# Patient Record
Sex: Female | Born: 1985 | Race: Black or African American | Hispanic: No | Marital: Single | Smoking: Never smoker
Health system: Southern US, Community
[De-identification: ages and names within clinical notes are randomized; demographics above are authoritative.]

## PROBLEM LIST (undated history)

## (undated) DIAGNOSIS — L309 Dermatitis, unspecified: Secondary | ICD-10-CM

## (undated) HISTORY — PX: OTHER SURGICAL HISTORY: SHX169

## (undated) HISTORY — DX: Dermatitis, unspecified: L30.9

---

## 2013-01-27 ENCOUNTER — Encounter (HOSPITAL_COMMUNITY): Payer: Self-pay | Admitting: Emergency Medicine

## 2013-01-27 ENCOUNTER — Emergency Department (HOSPITAL_COMMUNITY)
Admission: EM | Admit: 2013-01-27 | Discharge: 2013-01-27 | Disposition: A | Discharge status: Home / Self Care | Payer: Self-pay | Attending: Emergency Medicine | Admitting: Emergency Medicine

## 2013-01-27 DIAGNOSIS — Z0000 Encounter for general adult medical examination without abnormal findings: Secondary | ICD-10-CM

## 2013-01-27 DIAGNOSIS — Z0289 Encounter for other administrative examinations: Secondary | ICD-10-CM | POA: Insufficient documentation

## 2013-01-27 DIAGNOSIS — F17200 Nicotine dependence, unspecified, uncomplicated: Secondary | ICD-10-CM | POA: Insufficient documentation

## 2013-01-27 MED ORDER — ACETAMINOPHEN 325 MG PO TABS
650.0000 mg | ORAL_TABLET | Freq: Once | ORAL | Status: AC
  Administered 2013-01-27: 650 mg via ORAL
  Filled 2013-01-27: qty 2

## 2013-01-27 NOTE — ED Notes (Signed)
Pt states she wants to donate plasma but they would allow her to because her PR read 106 on their machine. States she was instructed to come to er to obtain permission to donate plasma. States she does not think her pulse rate was high, she denies any cardiac complaints and states she "Feels fine." A&Ox4, resp e/u
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
## 2013-01-30 NOTE — ED Provider Notes (Signed)
Medical screening examination/treatment/procedure(s) were performed by non-physician practitioner and as supervising physician I was immediately available for consultation/collaboration.  Christopher J. Pollina, MD 01/30/13 1712 

## 2013-03-19 ENCOUNTER — Emergency Department (HOSPITAL_COMMUNITY)
Admission: EM | Admit: 2013-03-19 | Discharge: 2013-03-20 | Disposition: A | Discharge status: Home / Self Care | Payer: Self-pay | Attending: Emergency Medicine | Admitting: Emergency Medicine

## 2013-03-19 ENCOUNTER — Encounter (HOSPITAL_COMMUNITY): Payer: Self-pay | Admitting: Emergency Medicine

## 2013-03-19 DIAGNOSIS — F17200 Nicotine dependence, unspecified, uncomplicated: Secondary | ICD-10-CM | POA: Insufficient documentation

## 2013-03-19 DIAGNOSIS — J309 Allergic rhinitis, unspecified: Secondary | ICD-10-CM

## 2013-03-19 DIAGNOSIS — I779 Disorder of arteries and arterioles, unspecified: Secondary | ICD-10-CM | POA: Insufficient documentation

## 2013-03-19 NOTE — ED Notes (Signed)
Pt states she has been having a scratchy throat, nasal congestions, and chills for two days.  Pt states she recently got a new cat, and does not know if she may be allergic

## 2013-03-20 LAB — RAPID STREP SCREEN (MED CTR MEBANE ONLY): Streptococcus, Group A Screen (Direct): NEGATIVE

## 2013-03-20 NOTE — ED Provider Notes (Signed)
Medical screening examination/treatment/procedure(s) were performed by non-physician practitioner and as supervising physician I was immediately available for consultation/collaboration.   Juana Montini, MD 03/20/13 0828 

## 2013-03-20 NOTE — ED Provider Notes (Signed)
CSN: [id number]     Arrival date & time 03/19/13  2040 History   First MD Initiated Contact with Patient 03/19/13 2304     Chief Complaint  Patient presents with  . URI   (Consider location/radiation/quality/duration/timing/severity/associated sxs/prior Treatment) HPI History provided by pt.   Pt presents w/ scratchy throat x 2-3 days.  Associated w/ nasal congestion, non-productive cough and alternating chills/sweats.  Denies fever, headache, sneezing, throat pain, chest pain, SOB.  No known sick contacts.  Bought a new cat the day of onset of sx.  No PMH.  History reviewed. No pertinent past medical history. History reviewed. No pertinent past surgical history. No family history on file. History  Substance Use Topics  . Smoking status: Current Every Day Smoker    Types: Cigarettes  . Smokeless tobacco: Not on file  . Alcohol Use: No   OB History   Grav Para Term Preterm Abortions TAB SAB Ect Mult Living                 Review of Systems  All other systems reviewed and are negative.    Allergies  Review of patient's allergies indicates no known allergies.  Home Medications  No current outpatient prescriptions on file. BP 143/77  Pulse 80  Temp(Src) 98.7 F (37.1 C) (Oral)  Resp 16  Ht 5\' 2"  (1.575 m)  Wt 155 lb 7 oz (70.506 kg)  BMI 28.42 kg/m2  SpO2 100%  LMP 03/04/2013 Physical Exam  Nursing note and vitals reviewed. Constitutional: She is oriented to person, place, and time. She appears well-developed and well-nourished. No distress.  HENT:  Head: Normocephalic and atraumatic.  Erythema soft palate, uvula and posterior pharynx.  Bilateral tonsillar exudate.  Uvula mid-line and no trismus.  Eyes:  Normal appearance  Neck: Normal range of motion.  Cardiovascular: Normal rate and regular rhythm.   Pulmonary/Chest: Effort normal and breath sounds normal. No respiratory distress.  Musculoskeletal: Normal range of motion.  Lymphadenopathy:    She has cervical  adenopathy.  Neurological: She is alert and oriented to person, place, and time.  Skin: Skin is warm and dry. No rash noted.  Psychiatric: She has a normal mood and affect. Her behavior is normal.    ED Course  Procedures (including critical care time) Labs Review Labs Reviewed - No data to display Imaging Review No results found.  EKG Interpretation   None       MDM   1. Allergic rhinitis    27yo healthy F presents w/ nasal congestion, cough and pruritic/scratchy throat x 2-3 days.  Onset the same day that she brought home a new cat and patient concerned she may be allergic.  Exam sig for tonsillar exudate and injection.  Rapid strep screen pending.  12:12 AM   Strep screen neg.  Results discussed w/ pt.  Recommended claritin and benadryl for what is likely an allergic reaction to cat, but rest and fluids for possible virus.  Return precautions discussed.     Otilio Miu, PA-C 03/20/13 [phone]

## 2013-03-22 LAB — CULTURE, GROUP A STREP

## 2014-08-20 ENCOUNTER — Emergency Department (HOSPITAL_COMMUNITY)
Admission: EM | Admit: 2014-08-20 | Discharge: 2014-08-20 | Disposition: A | Discharge status: Home / Self Care | Payer: Self-pay | Attending: Emergency Medicine | Admitting: Emergency Medicine

## 2014-08-20 ENCOUNTER — Encounter (HOSPITAL_COMMUNITY): Payer: Self-pay | Admitting: *Deleted

## 2014-08-20 DIAGNOSIS — Z720 Tobacco use: Secondary | ICD-10-CM | POA: Insufficient documentation

## 2014-08-20 DIAGNOSIS — B372 Candidiasis of skin and nail: Secondary | ICD-10-CM | POA: Insufficient documentation

## 2014-08-20 DIAGNOSIS — B3789 Other sites of candidiasis: Secondary | ICD-10-CM | POA: Insufficient documentation

## 2014-08-20 MED ORDER — NYSTATIN 100000 UNIT/GM EX CREA: 1.0000 "application " | TOPICAL_CREAM | Freq: Three times a day (TID) | CUTANEOUS | Status: DC

## 2014-08-20 NOTE — ED Notes (Signed)
Pt in c/o rash under her breasts since last week, no distress noted

## 2014-08-20 NOTE — Discharge Instructions (Signed)
Use nystatin cream as directed to help with this skin rash. Keep the area clean and dry, and change your clothes when they become moist. Follow up with [facility] wellness in 1 week for recheck and to establish care. Return to the ER for changes or worsening symptoms.   Yeast Infection of the Skin Some yeast on the skin is normal, but sometimes it causes an infection. If you have a yeast infection, it shows up as white or light brown patches on brown skin. You can see it better in the summer on tan skin. It causes light-colored holes in your suntan. It can happen on any area of the body. This cannot be passed from person to person. HOME CARE  Scrub your skin daily with a dandruff shampoo. Your rash may take a couple weeks to get well.  Do not scratch or itch the rash. GET HELP RIGHT AWAY IF:   You get another infection from scratching. The skin may get warm, red, and may ooze fluid.  The infection does not seem to be getting better. MAKE SURE YOU:  Understand these instructions.  Will watch your condition.  Will get help right away if you are not doing well or get worse. Document Released: 02/18/2008 Document Revised: 05/30/2011 Document Reviewed: 02/18/2008 [facility] Patient Information 2015 [place], [place]. This information is not intended to replace advice given to you by your health care provider. Make sure you discuss any questions you have with your health care provider.  Rash A rash is a change in the color or feel of your skin. There are many different types of rashes. You may have other problems along with your rash. HOME CARE  Avoid the thing that caused your rash.  Do not scratch your rash.  You may take cools baths to help stop itching.  Only take medicines as told by your doctor.  Keep all doctor visits as told. GET HELP RIGHT AWAY IF:   Your pain, puffiness (swelling), or redness gets worse.  You have a fever.  You have new or severe problems.  You have  body aches, watery poop (diarrhea), or you throw up (vomit).  Your rash is not better after 3 days. MAKE SURE YOU:   Understand these instructions.  Will watch your condition.  Will get help right away if you are not doing well or get worse. Document Released: 08/24/2007 Document Revised: 05/30/2011 Document Reviewed: 12/20/2010 [facility] Patient Information 2015 [place], [place]. This information is not intended to replace advice given to you by your health care provider. Make sure you discuss any questions you have with your health care provider.

## 2014-08-20 NOTE — ED Provider Notes (Signed)
CSN: [id number]     Arrival date & time 08/20/14  1725 History  This chart was scribed for Levi StraussMercedes Camprubi-Soms, PA-C, working with Vanetta MuldersScott Zackowski, MD by Chestine SporeSoijett Blue, ED Scribe. The patient was seen in room TR06C/TR06C at 5:39 PM.    Chief Complaint  Patient presents with  . Rash      Patient is a 29 y.o. female presenting with rash. The history is provided by the patient. No language interpreter was used.  Rash Location:  Torso Torso rash location:  L chest and R chest Quality: itchiness   Quality: not draining, not painful, not red, not swelling and not weeping   Severity:  Moderate Onset quality:  Sudden Duration:  1 week Timing:  Constant Progression:  Unchanged Chronicity:  New Context: new detergent/soap   Context: not animal contact, not exposure to similar rash, not medications, not plant contact and not sick contacts   Relieved by: tea tree oil and apple cider vinegar. Worsened by:  Nothing tried Ineffective treatments:  None tried Associated symptoms: no abdominal pain, no fever, no joint pain, no myalgias, no nausea, no shortness of breath, no throat swelling, no tongue swelling and not vomiting      Gregg L Ezequiel EssexGable is a 29 y.o. female with no significant medical hx who presents to the Emergency department complaining of rash onset 1.5 week. She reports that the rash is under both breasts and it itches without pain. Pt reports that it feels as if her skin is discolored and it is dark brown under her breasts with no redness. Pt switched detergents last week, but she denies any new soaps/medications/plants/animals contact or exposure to similar rash. No other affected individuals at home. She states that she has tried keeping the area dried, apple cider vinegar, and tea tree oil with mild relief for her symptoms. No known aggravating factors. She denies redness, warmth, drainage, swelling, CP, SOB, abdominal pain, n/v, tongue swelling, lip swelling, numbness, weakness, and any  other symptoms. Pt does not have a PCP at this time.    History reviewed. No pertinent past medical history. History reviewed. No pertinent past surgical history. History reviewed. No pertinent family history. History  Substance Use Topics  . Smoking status: Current Every Day Smoker    Types: Cigarettes  . Smokeless tobacco: Not on file  . Alcohol Use: No   OB History    No data available     Review of Systems  Constitutional: Negative for fever and chills.  Respiratory: Negative for shortness of breath.   Gastrointestinal: Negative for nausea, vomiting and abdominal pain.  Musculoskeletal: Negative for myalgias and arthralgias.  Skin: Positive for rash.  Allergic/Immunologic: Negative for immunocompromised state.  Neurological: Negative for weakness and numbness.   A complete 10 system review of systems was obtained and all systems are negative except as noted in the HPI and PMH.     Allergies  Review of patient's allergies indicates no known allergies.  Home Medications   Prior to Admission medications   Not on File   BP 125/73 mmHg  Pulse 93  Temp(Src) 98.9 F (37.2 C) (Oral)  Resp 18  SpO2 99% Physical Exam  Constitutional: She is oriented to person, place, and time. Vital signs are normal. She appears well-developed and well-nourished.  Non-toxic appearance. No distress.  Afebrile, nontoxic, NAD  HENT:  Head: Normocephalic and atraumatic.  Mouth/Throat: Mucous membranes are normal.  Eyes: Conjunctivae and EOM are normal. Right eye exhibits no discharge. Left eye  exhibits no discharge.  Neck: Normal range of motion. Neck supple.  Cardiovascular: Normal rate.   Pulmonary/Chest: Effort normal. No respiratory distress.  Abdominal: Normal appearance. She exhibits no distension.  Musculoskeletal: Normal range of motion.  Neurological: She is alert and oriented to person, place, and time. She has normal strength. No sensory deficit.  Skin: Skin is warm, dry and  intact. Rash noted. Rash is papular.  Intertrigonus folds under each breast with a hyperpigmented macerated plaques with some erythematous papules surrounding the area. No warmth or induration, no cellulitis, non-TTP without drainage.   Psychiatric: She has a normal mood and affect. Her behavior is normal.  Nursing note and vitals reviewed.   ED Course  Procedures (including critical care time) DIAGNOSTIC STUDIES: Oxygen Saturation is 99% on RA, nl by my interpretation.    COORDINATION OF CARE: 5:47 PM-Discussed treatment plan which includes nystatin Rx and [facility] and wellness referral with pt at bedside and pt agreed to plan.   Labs Review Labs Reviewed - No data to display  Imaging Review No results found.   EKG Interpretation None      MDM   Final diagnoses:  Yeast infection of the skin  Candidiasis of breast10    28 y.o. female here with yeast infection under each breast, in the intertrigonous folds. No surrounding cellulitis or secondary bacterial infection. No other affected areas, doubt scabies or other parasitic rash. Will have her use nystatin cream x10 days and f/up with [facility] in 1wk for recheck and for establishment of PCP care. I explained the diagnosis and have given explicit precautions to return to the ER including for any other new or worsening symptoms. The patient understands and accepts the medical plan as it's been dictated and I have answered their questions. Discharge instructions concerning home care and prescriptions have been given. The patient is STABLE and is discharged to home in good condition.   I personally performed the services described in this documentation, which was scribed in my presence. The recorded information has been reviewed and is accurate.  BP 125/73 mmHg  Pulse 93  Temp(Src) 98.9 F (37.2 C) (Oral)  Resp 18  SpO2 99%  Meds ordered this encounter  Medications  . nystatin cream (MYCOSTATIN)    Sig: Apply 1 application  topically 3 (three) times daily. Apply to affected area every 8 hours x 10 days    Dispense:  30 g    Refill:  0    Order Specific Question:  Supervising Provider    Answer:  Eber Hong [[id number]]      Jarris Kortz Camprubi-Soms, PA-C 08/20/14 1800  Vanetta Mulders, MD 08/21/14 1640

## 2014-09-15 ENCOUNTER — Ambulatory Visit: Payer: Self-pay

## 2015-04-21 ENCOUNTER — Encounter (HOSPITAL_COMMUNITY): Payer: Self-pay | Admitting: *Deleted

## 2015-04-21 ENCOUNTER — Emergency Department (HOSPITAL_COMMUNITY)
Admission: EM | Admit: 2015-04-21 | Discharge: 2015-04-21 | Disposition: A | Discharge status: Home / Self Care | Payer: BLUE CROSS/BLUE SHIELD | Attending: Emergency Medicine | Admitting: Emergency Medicine

## 2015-04-21 DIAGNOSIS — Z3202 Encounter for pregnancy test, result negative: Secondary | ICD-10-CM | POA: Insufficient documentation

## 2015-04-21 DIAGNOSIS — N3090 Cystitis, unspecified without hematuria: Secondary | ICD-10-CM

## 2015-04-21 DIAGNOSIS — R350 Frequency of micturition: Secondary | ICD-10-CM | POA: Diagnosis present

## 2015-04-21 DIAGNOSIS — F17210 Nicotine dependence, cigarettes, uncomplicated: Secondary | ICD-10-CM | POA: Diagnosis not present

## 2015-04-21 LAB — URINE MICROSCOPIC-ADD ON

## 2015-04-21 LAB — URINALYSIS, ROUTINE W REFLEX MICROSCOPIC
Bilirubin Urine: NEGATIVE
Glucose, UA: NEGATIVE mg/dL
Ketones, ur: NEGATIVE mg/dL
Leukocytes, UA: NEGATIVE
NITRITE: NEGATIVE
Protein, ur: NEGATIVE mg/dL
Specific Gravity, Urine: 1.024 (ref 1.005–1.030)
pH: 6.5 (ref 5.0–8.0)

## 2015-04-21 LAB — CBG MONITORING, ED: GLUCOSE-CAPILLARY: 87 mg/dL (ref 65–99)

## 2015-04-21 LAB — POC URINE PREG, ED: PREG TEST UR: NEGATIVE

## 2015-04-21 MED ORDER — NITROFURANTOIN MONOHYD MACRO 100 MG PO CAPS
100.0000 mg | ORAL_CAPSULE | Freq: Two times a day (BID) | ORAL | Status: DC
Start: 2015-04-21 — End: 2023-08-09

## 2015-04-21 NOTE — ED Notes (Signed)
Pt c/o urinary frequency x 3 years.

## 2015-04-21 NOTE — Discharge Instructions (Signed)

## 2015-04-21 NOTE — ED Provider Notes (Signed)
CSN: [id number]     Arrival date & time 04/21/15  1025 History   First MD Initiated Contact with Patient 04/21/15 1028     Chief Complaint  Patient presents with  . Urinary Frequency     Patient is a 30 y.o. female presenting with frequency. The history is provided by the patient. No language interpreter was used.  Urinary Frequency   Shelly Snyder is a 30 y.o. female who presents to the Emergency Department complaining of urinary frequency.   She reports 3 years of urinary frequency. She denies any dysuria. She states she is going many times a day and sometimes she voids a full bladder and other times she has a small amount empty but has a sensation to go. She also endorses excessive thirst. She has mild lower abdominal cramping that started today. She was seen for the same symptoms 3 years ago and had no diagnosis at that time. Diabetes does run in her family. She denies any fevers, chest pain, vomiting, nausea, vaginal discharge.   History reviewed. No pertinent past medical history. History reviewed. No pertinent past surgical history. No family history on file. Social History  Substance Use Topics  . Smoking status: Current Every Day Smoker -- 0.50 packs/day    Types: Cigarettes  . Smokeless tobacco: None  . Alcohol Use: No   OB History    No data available     Review of Systems  Genitourinary: Positive for frequency.  All other systems reviewed and are negative.     Allergies  Review of patient's allergies indicates no known allergies.  Home Medications   Prior to Admission medications   Medication Sig Start Date End Date Taking? Authorizing Provider  ibuprofen (ADVIL,MOTRIN) 400 MG tablet Take 400 mg by mouth every 6 (six) hours as needed for mild pain.   Yes Historical Provider, MD  nitrofurantoin, macrocrystal-monohydrate, (MACROBID) 100 MG capsule Take 1 capsule (100 mg total) by mouth 2 (two) times daily. 04/21/15   Tilden Fossa, MD   BP 114/81 mmHg   Temp(Src) 98.3 F (36.8 C) (Oral)  Resp 18  Ht  (1.6 m)  Wt 160 lb (72.576 kg)  BMI 28.35 kg/m2  SpO2 100%  LMP 04/15/2015 Physical Exam  Constitutional: She is oriented to person, place, and time. She appears well-developed and well-nourished.  HENT:  Head: Normocephalic and atraumatic.  Cardiovascular: Normal rate and regular rhythm.   No murmur heard. Pulmonary/Chest: Effort normal and breath sounds normal. No respiratory distress.  Abdominal: Soft. There is no tenderness. There is no rebound and no guarding.  Musculoskeletal: She exhibits no edema or tenderness.  Neurological: She is alert and oriented to person, place, and time.  Skin: Skin is warm and dry.  Psychiatric: She has a normal mood and affect. Her behavior is normal.  Nursing note and vitals reviewed.   ED Course  Procedures (including critical care time) Labs Review Labs Reviewed  URINALYSIS, ROUTINE W REFLEX MICROSCOPIC (NOT AT [facility]) - Abnormal; Notable for the following:    APPearance CLOUDY (*)    Hgb urine dipstick SMALL (*)    All other components within normal limits  URINE MICROSCOPIC-ADD ON - Abnormal; Notable for the following:    Squamous Epithelial / LPF 6-30 (*)    Bacteria, UA MANY (*)    All other components within normal limits  POC URINE PREG, ED  CBG MONITORING, ED    Imaging Review No results found. I have personally reviewed and evaluated these  images and lab results as part of my medical decision-making.   EKG Interpretation None      MDM   Final diagnoses:  Cystitis      Patient here for evaluation of urinary frequency. Ua is concerning for cystitis. She is nontoxic appearing on examination without any systemic symptoms. Discussed outpatient follow-up as well as return precautions.    Tilden Fossa, MD 04/21/15 [phone]

## 2015-04-21 NOTE — ED Notes (Signed)
Pt here with c/o urinary frequency x 3 years.  Pt denies any burning or problems urinating, just frequency.  Pt advises she urinates 15 times q day.

## 2020-03-21 DIAGNOSIS — S069XAA Unspecified intracranial injury with loss of consciousness status unknown, initial encounter: Secondary | ICD-10-CM

## 2020-03-21 HISTORY — DX: Unspecified intracranial injury with loss of consciousness status unknown, initial encounter: S06.9XAA

## 2020-10-15 ENCOUNTER — Emergency Department (HOSPITAL_COMMUNITY): Payer: Self-pay

## 2020-10-15 ENCOUNTER — Encounter (HOSPITAL_COMMUNITY): Payer: Self-pay | Admitting: Emergency Medicine

## 2020-10-15 ENCOUNTER — Other Ambulatory Visit: Payer: Self-pay

## 2020-10-15 ENCOUNTER — Inpatient Hospital Stay (HOSPITAL_COMMUNITY)
Admission: EM | Admit: 2020-10-15 | Discharge: 2020-10-17 | DRG: 083 | Disposition: A | Discharge status: Home / Self Care | Payer: Self-pay | Attending: Neurological Surgery | Admitting: Neurological Surgery

## 2020-10-15 DIAGNOSIS — S062X9A Diffuse traumatic brain injury with loss of consciousness of unspecified duration, initial encounter: Principal | ICD-10-CM | POA: Diagnosis present

## 2020-10-15 DIAGNOSIS — S069X9A Unspecified intracranial injury with loss of consciousness of unspecified duration, initial encounter: Secondary | ICD-10-CM | POA: Diagnosis present

## 2020-10-15 DIAGNOSIS — Z23 Encounter for immunization: Secondary | ICD-10-CM

## 2020-10-15 DIAGNOSIS — Z20822 Contact with and (suspected) exposure to covid-19: Secondary | ICD-10-CM | POA: Diagnosis present

## 2020-10-15 DIAGNOSIS — R402412 Glasgow coma scale score 13-15, at arrival to emergency department: Secondary | ICD-10-CM | POA: Diagnosis present

## 2020-10-15 DIAGNOSIS — S02122A Fracture of orbital roof, left side, initial encounter for closed fracture: Secondary | ICD-10-CM | POA: Diagnosis present

## 2020-10-15 DIAGNOSIS — T1490XA Injury, unspecified, initial encounter: Secondary | ICD-10-CM

## 2020-10-15 DIAGNOSIS — S0990XA Unspecified injury of head, initial encounter: Principal | ICD-10-CM

## 2020-10-15 DIAGNOSIS — S020XXB Fracture of vault of skull, initial encounter for open fracture: Secondary | ICD-10-CM

## 2020-10-15 DIAGNOSIS — I619 Nontraumatic intracerebral hemorrhage, unspecified: Secondary | ICD-10-CM

## 2020-10-15 DIAGNOSIS — S069XAA Unspecified intracranial injury with loss of consciousness status unknown, initial encounter: Secondary | ICD-10-CM | POA: Diagnosis present

## 2020-10-15 DIAGNOSIS — S12491A Other nondisplaced fracture of fifth cervical vertebra, initial encounter for closed fracture: Secondary | ICD-10-CM | POA: Diagnosis present

## 2020-10-15 LAB — CBC WITH DIFFERENTIAL/PLATELET
Abs Immature Granulocytes: 0.02 10*3/uL (ref 0.00–0.07)
Basophils Absolute: 0.1 10*3/uL (ref 0.0–0.1)
Basophils Relative: 1 %
Eosinophils Absolute: 0.2 10*3/uL (ref 0.0–0.5)
Eosinophils Relative: 2 %
HCT: 38.5 % (ref 36.0–46.0)
Hemoglobin: 12.7 g/dL (ref 12.0–15.0)
Immature Granulocytes: 0 %
Lymphocytes Relative: 50 %
Lymphs Abs: 4.8 10*3/uL — ABNORMAL HIGH (ref 0.7–4.0)
MCH: 29.2 pg (ref 26.0–34.0)
MCHC: 33 g/dL (ref 30.0–36.0)
MCV: 88.5 fL (ref 80.0–100.0)
Monocytes Absolute: 0.7 10*3/uL (ref 0.1–1.0)
Monocytes Relative: 8 %
Neutro Abs: 3.7 10*3/uL (ref 1.7–7.7)
Neutrophils Relative %: 39 %
Platelets: 306 10*3/uL (ref 150–400)
RBC: 4.35 MIL/uL (ref 3.87–5.11)
RDW: 12.9 % (ref 11.5–15.5)
WBC: 9.4 10*3/uL (ref 4.0–10.5)
nRBC: 0 % (ref 0.0–0.2)

## 2020-10-15 LAB — I-STAT CHEM 8, ED
BUN: 13 mg/dL (ref 6–20)
Calcium, Ion: 1.15 mmol/L (ref 1.15–1.40)
Chloride: 106 mmol/L (ref 98–111)
Creatinine, Ser: 0.7 mg/dL (ref 0.44–1.00)
Glucose, Bld: 153 mg/dL — ABNORMAL HIGH (ref 70–99)
HCT: 39 % (ref 36.0–46.0)
Hemoglobin: 13.3 g/dL (ref 12.0–15.0)
Potassium: 2.9 mmol/L — ABNORMAL LOW (ref 3.5–5.1)
Sodium: 140 mmol/L (ref 135–145)
TCO2: 23 mmol/L (ref 22–32)

## 2020-10-15 LAB — COMPREHENSIVE METABOLIC PANEL
ALT: 14 U/L (ref 0–44)
AST: 19 U/L (ref 15–41)
Albumin: 4 g/dL (ref 3.5–5.0)
Alkaline Phosphatase: 45 U/L (ref 38–126)
Anion gap: 9 (ref 5–15)
BUN: 12 mg/dL (ref 6–20)
CO2: 23 mmol/L (ref 22–32)
Calcium: 9.1 mg/dL (ref 8.9–10.3)
Chloride: 106 mmol/L (ref 98–111)
Creatinine, Ser: 0.87 mg/dL (ref 0.44–1.00)
GFR, Estimated: >60 mL/min (ref >60)
Glucose, Bld: 157 mg/dL — ABNORMAL HIGH (ref 70–99)
Potassium: 2.9 mmol/L — ABNORMAL LOW (ref 3.5–5.1)
Sodium: 138 mmol/L (ref 135–145)
Total Bilirubin: 0.4 mg/dL (ref 0.3–1.2)
Total Protein: 6.7 g/dL (ref 6.5–8.1)

## 2020-10-15 LAB — I-STAT BETA HCG BLOOD, ED (MC, WL, AP ONLY): I-stat hCG, quantitative: <5 m[IU]/mL (ref <5)

## 2020-10-15 LAB — PROTIME-INR
INR: 1 (ref 0.8–1.2)
Prothrombin Time: 13.3 seconds (ref 11.4–15.2)

## 2020-10-15 MED ORDER — POTASSIUM CHLORIDE 10 MEQ/100ML IV SOLN
10.0000 meq | Freq: Once | INTRAVENOUS | Status: AC
  Administered 2020-10-15: 10 meq via INTRAVENOUS
  Filled 2020-10-15: qty 100

## 2020-10-15 MED ORDER — ONDANSETRON HCL 4 MG/2ML IJ SOLN
4.0000 mg | Freq: Once | INTRAMUSCULAR | Status: AC
  Administered 2020-10-15: 4 mg via INTRAVENOUS
  Filled 2020-10-15: qty 2

## 2020-10-15 MED ORDER — IOHEXOL 300 MG/ML  SOLN
100.0000 mL | Freq: Once | INTRAMUSCULAR | Status: AC | PRN
  Administered 2020-10-15: 100 mL via INTRAVENOUS

## 2020-10-15 MED ORDER — TETANUS-DIPHTH-ACELL PERTUSSIS 5-2.5-18.5 LF-MCG/0.5 IM SUSY
0.5000 mL | PREFILLED_SYRINGE | Freq: Once | INTRAMUSCULAR | Status: AC
  Administered 2020-10-15: 0.5 mL via INTRAMUSCULAR
  Filled 2020-10-15: qty 0.5

## 2020-10-15 MED ORDER — SODIUM CHLORIDE 0.9 % IV BOLUS
500.0000 mL | Freq: Once | INTRAVENOUS | Status: AC
  Administered 2020-10-15: 500 mL via INTRAVENOUS
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
## 2020-10-15 NOTE — Progress Notes (Signed)
   10/15/20 2300  Clinical Encounter Type  Visited With Patient and family together  Visit Type Trauma  Referral From Nurse  Consult/Referral To Chaplain  Spiritual Encounters  Spiritual Needs Emotional;Prayer  The chaplain went by the patient's room per the nurse's request to speak with mom. The chaplain provided emotional support and listened to mom talk about her concerns. The chaplain offered prayer of strength and healing. The chaplain will continue to follow up as needed to provide support.
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
## 2020-10-15 NOTE — ED Notes (Signed)
Pt being transported to CT with TRN
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
## 2020-10-16 ENCOUNTER — Inpatient Hospital Stay (HOSPITAL_COMMUNITY): Payer: Self-pay

## 2020-10-16 DIAGNOSIS — S069XAA Unspecified intracranial injury with loss of consciousness status unknown, initial encounter: Secondary | ICD-10-CM | POA: Diagnosis present

## 2020-10-16 DIAGNOSIS — Z20822 Contact with and (suspected) exposure to covid-19: Secondary | ICD-10-CM | POA: Diagnosis present

## 2020-10-16 DIAGNOSIS — R402412 Glasgow coma scale score 13-15, at arrival to emergency department: Secondary | ICD-10-CM | POA: Diagnosis present

## 2020-10-16 DIAGNOSIS — S069X9A Unspecified intracranial injury with loss of consciousness of unspecified duration, initial encounter: Secondary | ICD-10-CM | POA: Diagnosis present

## 2020-10-16 DIAGNOSIS — S12491A Other nondisplaced fracture of fifth cervical vertebra, initial encounter for closed fracture: Secondary | ICD-10-CM | POA: Diagnosis present

## 2020-10-16 DIAGNOSIS — S02122A Fracture of orbital roof, left side, initial encounter for closed fracture: Secondary | ICD-10-CM | POA: Diagnosis present

## 2020-10-16 DIAGNOSIS — Z23 Encounter for immunization: Secondary | ICD-10-CM | POA: Diagnosis not present

## 2020-10-16 DIAGNOSIS — S062X9A Diffuse traumatic brain injury with loss of consciousness of unspecified duration, initial encounter: Secondary | ICD-10-CM | POA: Diagnosis present

## 2020-10-16 DIAGNOSIS — S020XXB Fracture of vault of skull, initial encounter for open fracture: Secondary | ICD-10-CM | POA: Diagnosis present

## 2020-10-16 LAB — SARS CORONAVIRUS 2 (TAT 6-24 HRS): SARS Coronavirus 2: NEGATIVE

## 2020-10-16 MED ORDER — BISACODYL 10 MG RE SUPP: 10.0000 mg | Freq: Every day | RECTAL | Status: DC | PRN

## 2020-10-16 MED ORDER — POLYETHYLENE GLYCOL 3350 17 G PO PACK: 17.0000 g | PACK | Freq: Every day | ORAL | Status: DC | PRN

## 2020-10-16 MED ORDER — OXYCODONE HCL 5 MG PO TABS
5.0000 mg | ORAL_TABLET | ORAL | Status: DC | PRN
  Administered 2020-10-17: 5 mg via ORAL
  Filled 2020-10-16 (×2): qty 1

## 2020-10-16 MED ORDER — FLEET ENEMA 7-19 GM/118ML RE ENEM: 1.0000 | ENEMA | Freq: Once | RECTAL | Status: DC | PRN

## 2020-10-16 MED ORDER — MORPHINE SULFATE (PF) 2 MG/ML IV SOLN
1.0000 mg | INTRAVENOUS | Status: DC | PRN
  Administered 2020-10-16 (×3): 1 mg via INTRAVENOUS
  Filled 2020-10-16 (×3): qty 1

## 2020-10-16 MED ORDER — ACETAMINOPHEN 325 MG PO TABS
650.0000 mg | ORAL_TABLET | Freq: Four times a day (QID) | ORAL | Status: DC | PRN
  Administered 2020-10-16 – 2020-10-17 (×2): 650 mg via ORAL
  Filled 2020-10-16 (×2): qty 2

## 2020-10-16 MED ORDER — SODIUM CHLORIDE 0.9 % IV SOLN
12.5000 mg | Freq: Four times a day (QID) | INTRAVENOUS | Status: DC | PRN
  Administered 2020-10-16: 12.5 mg via INTRAVENOUS
  Filled 2020-10-16: qty 0.5

## 2020-10-16 MED ORDER — CIPROFLOXACIN-DEXAMETHASONE 0.3-0.1 % OT SUSP
4.0000 [drp] | Freq: Two times a day (BID) | OTIC | Status: DC
  Administered 2020-10-17: 4 [drp] via OTIC
  Filled 2020-10-16: qty 7.5

## 2020-10-16 MED ORDER — DOCUSATE SODIUM 100 MG PO CAPS
100.0000 mg | ORAL_CAPSULE | Freq: Two times a day (BID) | ORAL | Status: DC
  Administered 2020-10-16 – 2020-10-17 (×2): 100 mg via ORAL
  Filled 2020-10-16 (×2): qty 1

## 2020-10-16 MED ORDER — ONDANSETRON HCL 4 MG PO TABS: 4.0000 mg | ORAL_TABLET | Freq: Four times a day (QID) | ORAL | Status: DC | PRN

## 2020-10-16 MED ORDER — SODIUM CHLORIDE 0.9% FLUSH
3.0000 mL | Freq: Two times a day (BID) | INTRAVENOUS | Status: DC
  Administered 2020-10-16 – 2020-10-17 (×3): 3 mL via INTRAVENOUS

## 2020-10-16 MED ORDER — LEVETIRACETAM IN NACL 500 MG/100ML IV SOLN
500.0000 mg | Freq: Two times a day (BID) | INTRAVENOUS | Status: DC
  Administered 2020-10-16 – 2020-10-17 (×2): 500 mg via INTRAVENOUS
  Filled 2020-10-16 (×3): qty 100

## 2020-10-16 MED ORDER — ONDANSETRON HCL 4 MG/2ML IJ SOLN
4.0000 mg | Freq: Four times a day (QID) | INTRAMUSCULAR | Status: DC | PRN
  Administered 2020-10-16 (×2): 4 mg via INTRAVENOUS
  Filled 2020-10-16 (×3): qty 2

## 2020-10-16 MED ORDER — MORPHINE SULFATE (PF) 4 MG/ML IV SOLN
4.0000 mg | Freq: Once | INTRAVENOUS | Status: AC
  Administered 2020-10-16: 4 mg via INTRAVENOUS
  Filled 2020-10-16: qty 1

## 2020-10-16 MED ORDER — ACETAMINOPHEN 650 MG RE SUPP: 650.0000 mg | Freq: Four times a day (QID) | RECTAL | Status: DC | PRN
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
## 2020-10-16 NOTE — ED Notes (Signed)
Bubba Hales friend [phone] requesting an update

## 2020-10-16 NOTE — TOC CAGE-AID Note (Signed)
Transition of Care [facility]) - CAGE-AID Screening   Patient Details  Name: Shelly Snyder MRN: [id number] Date of Birth: 20-Jun-1985  Transition of Care [facility]) CM/SW Contact:    Chayse Gracey C Tarpley-Carter, LCSWA Phone Number: 10/16/2020, 8:57 AM   Clinical Narrative: Pt is unable to participate in Cage Aid. 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  Pt is not appropriate for assessment.  Kourtney Montesinos Tarpley-Carter, MSW, LCSW-A Pronouns:  She/Her/Hers [facility]itions of Care Clinical Social Worker Direct Number:  [phone] [email or web address]   CAGE-AID Screening: Substance Abuse Screening unable to be completed due to: : Patient unable to participate (Pts cheif complaint is TBI.  Pt is not appropriate for assessment.)
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
## 2020-10-16 NOTE — ED Notes (Signed)
Patient continues to toss and turn in bed causing dislodged IV lines and detached telemetry leads. ED team unable to reestablish IV access at this time. IV consult placed. Telemetry leads reapplied.

## 2020-10-17 LAB — BASIC METABOLIC PANEL
Anion gap: 7 (ref 5–15)
BUN: 9 mg/dL (ref 6–20)
CO2: 26 mmol/L (ref 22–32)
Calcium: 9.3 mg/dL (ref 8.9–10.3)
Chloride: 107 mmol/L (ref 98–111)
Creatinine, Ser: 0.89 mg/dL (ref 0.44–1.00)
GFR, Estimated: >60 mL/min (ref >60)
Glucose, Bld: 108 mg/dL — ABNORMAL HIGH (ref 70–99)
Potassium: 4 mmol/L (ref 3.5–5.1)
Sodium: 140 mmol/L (ref 135–145)

## 2020-10-17 LAB — HIV ANTIBODY (ROUTINE TESTING W REFLEX): HIV Screen 4th Generation wRfx: NONREACTIVE

## 2020-10-17 MED ORDER — TRAMADOL HCL 50 MG PO TABS: 50.0000 mg | ORAL_TABLET | Freq: Four times a day (QID) | ORAL | 0 refills | Status: AC | PRN

## 2020-10-17 MED ORDER — LEVETIRACETAM 500 MG PO TABS: 500.0000 mg | ORAL_TABLET | Freq: Two times a day (BID) | ORAL | 0 refills | Status: DC

## 2020-10-17 NOTE — ED Notes (Addendum)
Patient becoming irate about going home. Informed patient that provider will be down to see patient for discharge soon. Patient refusing to keep vital sign equipment on at this time.
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
## 2020-10-17 NOTE — ED Notes (Signed)
Patient upset about the wait to go home. Patient informed that the provider will be coming to speak with her for discharge soon. Patient upset about not having clothes on and that her family has her clothes. Offered paper scrubs; patient refused to wear them at this time. Patient asked to use phone to call family; phone provided.

## 2020-10-17 NOTE — ED Notes (Signed)
Patient reporting she is ready to go home. Provider messaged about possible discharge.

## 2020-10-27 DIAGNOSIS — I609 Nontraumatic subarachnoid hemorrhage, unspecified: Secondary | ICD-10-CM | POA: Insufficient documentation

## 2020-10-27 DIAGNOSIS — S0232XS Fracture of orbital floor, left side, sequela: Secondary | ICD-10-CM | POA: Insufficient documentation

## 2020-10-27 DIAGNOSIS — S020XXD Fracture of vault of skull, subsequent encounter for fracture with routine healing: Secondary | ICD-10-CM

## 2020-10-27 DIAGNOSIS — H7201 Central perforation of tympanic membrane, right ear: Secondary | ICD-10-CM

## 2020-10-27 HISTORY — DX: Fracture of vault of skull, subsequent encounter for fracture with routine healing: S02.0XXD

## 2020-10-27 HISTORY — DX: Nontraumatic subarachnoid hemorrhage, unspecified: I60.9

## 2020-10-27 HISTORY — DX: Fracture of orbital floor, left side, sequela: S02.32XS

## 2020-10-27 HISTORY — DX: Central perforation of tympanic membrane, right ear: H72.01
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
## 2023-03-22 HISTORY — PX: NIPPLE SPARING MASTECTOMY: SHX6537

## 2023-05-05 DIAGNOSIS — Z13220 Encounter for screening for lipoid disorders: Secondary | ICD-10-CM | POA: Diagnosis not present

## 2023-05-05 DIAGNOSIS — R7301 Impaired fasting glucose: Secondary | ICD-10-CM | POA: Diagnosis not present

## 2023-05-05 DIAGNOSIS — E0590 Thyrotoxicosis, unspecified without thyrotoxic crisis or storm: Secondary | ICD-10-CM | POA: Diagnosis not present

## 2023-05-05 DIAGNOSIS — S069X1S Unspecified intracranial injury with loss of consciousness of 30 minutes or less, sequela: Secondary | ICD-10-CM | POA: Diagnosis not present

## 2023-05-05 DIAGNOSIS — M21611 Bunion of right foot: Secondary | ICD-10-CM | POA: Diagnosis not present

## 2023-05-05 DIAGNOSIS — L301 Dyshidrosis [pompholyx]: Secondary | ICD-10-CM | POA: Diagnosis not present

## 2023-08-08 ENCOUNTER — Other Ambulatory Visit: Payer: Self-pay | Admitting: Family Medicine

## 2023-08-08 DIAGNOSIS — H1031 Unspecified acute conjunctivitis, right eye: Secondary | ICD-10-CM

## 2023-08-08 MED ORDER — CIPROFLOXACIN HCL 0.3 % OP SOLN: OPHTHALMIC | 0 refills | Status: DC

## 2023-08-09 ENCOUNTER — Telehealth: Payer: Self-pay

## 2023-08-09 ENCOUNTER — Encounter: Payer: Self-pay | Admitting: Internal Medicine

## 2023-08-09 ENCOUNTER — Ambulatory Visit: Admitting: Internal Medicine

## 2023-08-09 VITALS — BP 122/76 | HR 69 | Temp 98.0°F | Ht 63.5 in | Wt 153.2 lb

## 2023-08-09 DIAGNOSIS — Z87820 Personal history of traumatic brain injury: Secondary | ICD-10-CM

## 2023-08-09 DIAGNOSIS — L03213 Periorbital cellulitis: Secondary | ICD-10-CM

## 2023-08-09 DIAGNOSIS — L309 Dermatitis, unspecified: Secondary | ICD-10-CM | POA: Diagnosis not present

## 2023-08-09 MED ORDER — AMOXICILLIN-POT CLAVULANATE 875-125 MG PO TABS: 1.0000 | ORAL_TABLET | Freq: Two times a day (BID) | ORAL | 0 refills | Status: AC

## 2023-08-09 NOTE — Telephone Encounter (Signed)
 Med req release signed by pt and fax to urologist and previous IM provider offices.
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
## 2023-08-10 DIAGNOSIS — Z87820 Personal history of traumatic brain injury: Secondary | ICD-10-CM | POA: Insufficient documentation

## 2023-08-10 DIAGNOSIS — L309 Dermatitis, unspecified: Secondary | ICD-10-CM | POA: Insufficient documentation

## 2023-08-11 ENCOUNTER — Other Ambulatory Visit: Payer: Self-pay | Admitting: Family Medicine

## 2023-08-11 ENCOUNTER — Ambulatory Visit (INDEPENDENT_AMBULATORY_CARE_PROVIDER_SITE_OTHER)

## 2023-08-11 DIAGNOSIS — L03213 Periorbital cellulitis: Secondary | ICD-10-CM

## 2023-08-11 MED ORDER — PREDNISONE 50 MG PO TABS: ORAL_TABLET | ORAL | 0 refills | Status: DC

## 2023-08-11 MED ORDER — METHYLPREDNISOLONE SODIUM SUCC 125 MG IJ SOLR
125.0000 mg | Freq: Once | INTRAMUSCULAR | Status: AC
  Administered 2023-08-11: 125 mg via INTRAMUSCULAR

## 2023-08-11 MED ORDER — CEFTRIAXONE SODIUM 1 G IJ SOLR
1.0000 g | Freq: Once | INTRAMUSCULAR | Status: AC
  Administered 2023-08-11: 1 g via INTRAMUSCULAR

## 2023-08-11 NOTE — Progress Notes (Signed)
 After obtaining consent, and per orders of Dr. Hildy Lowers, injection of Recephin 1g and Solu-Medrol given by Anders Katz. Patient instructed to remain in clinic for 20 minutes afterwards, and to report any adverse reaction to me immediately.   Pt tolerated injection well.

## 2023-08-18 ENCOUNTER — Ambulatory Visit: Admitting: Internal Medicine

## 2023-08-18 ENCOUNTER — Encounter: Payer: Self-pay | Admitting: Internal Medicine

## 2023-08-18 VITALS — BP 114/72 | HR 82 | Ht 63.0 in | Wt 153.0 lb

## 2023-08-18 DIAGNOSIS — L03213 Periorbital cellulitis: Secondary | ICD-10-CM

## 2023-08-18 MED ORDER — METHYLPREDNISOLONE SODIUM SUCC 125 MG IJ SOLR
125.0000 mg | Freq: Once | INTRAMUSCULAR | Status: AC
  Administered 2023-08-18: 125 mg via INTRAMUSCULAR

## 2023-08-18 MED ORDER — CEFTRIAXONE SODIUM 1 G IJ SOLR
1.0000 g | Freq: Once | INTRAMUSCULAR | Status: AC
  Administered 2023-08-18: 1 g via INTRAMUSCULAR

## 2023-08-18 NOTE — Progress Notes (Signed)
 [facility] [address] [place] [address] Dept: [phone] Dept Fax: [phone]  Acute Care Office Visit  Subjective:   Shelly Snyder May 05, 1985 08/18/2023  Chief Complaint  Patient presents with   Eye Pain    1 week follow up right eye redness, edema and pain. New symptoms of itch on eye lids. Pt has 1 day of antibiotics left.     HPI:  Shelly Snyder is a 38 year old female who presents for follow-up of periorbital cellulitis.  She has been compliant with her Augmentin  twice daily x 10 days.  She did receive a Rocephin  1 g injection and steroid Solu-Medrol  125 injection 1 week ago by primary care in addition to oral antibiotics.  She has 1 day of antibiotics left.  She reports much improvement in the swelling surrounding her eye.  No pain.  No visual changes.  There is still some minimal amount of swelling to the lateral aspect of the lower eyelid.   The following portions of the patient's history were reviewed and updated as appropriate: past medical history, past surgical history, family history, social history, allergies, medications, and problem list.   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 No pertinent surgical history. Family History  Problem Relation Age of Onset   Fibromyalgia Mother    Hyperlipidemia Mother    Diabetes type II Father    Cerebral palsy Sister    Breast cancer Paternal Aunt    Cervical  cancer Maternal Grandmother    Stroke Cousin     Current Outpatient Medications:    ciprofloxacin  (CILOXAN ) 0.3 % ophthalmic solution, Administer 1 drop, every 2 hours, while awake, for 2 days. Then 1 drop, every 4 hours, while awake, for the next 5 days., Disp: 5 mL, Rfl: 0   clobetasol cream (TEMOVATE) 0.05 %, Apply topically., Disp: , Rfl:    ibuprofen (ADVIL,MOTRIN) 400 MG tablet, Take 400 mg by mouth every 6 (six) hours as needed for mild pain., Disp: , Rfl:    mometasone (ELOCON) 0.1 % cream, Apply topically 2 (two) times daily., Disp: , Rfl:    doxycycline (MONODOX) 100 MG capsule, Take 100 mg by mouth. (Patient not taking: Reported on 08/18/2023), Disp: , Rfl:  Allergies  Allergen Reactions   Cefdinir Dermatitis     ROS: A complete ROS was performed with pertinent positives/negatives noted in the HPI. The remainder of the ROS are negative.    Objective:   Today's Vitals   08/18/23 0901  BP: 114/72  Pulse: 82  SpO2: 98%  Weight: 153 lb (69.4 kg)  Height: 5\' 3"  (1.6 m)    GENERAL: Well-appearing, in NAD. Well nourished.  SKIN: Pink, warm and dry. No rash. HEENT:    HEAD: Normocephalic, non-traumatic.  EYES: Conjunctive pink without exudate. PERRL, EOMI. minimal swelling to lateral aspect of lower eyelid on right eye.  No active drainage.  Very mild redness to lower eyelid. RESPIRATORY: Chest wall symmetrical. Respirations even  and non-labored.  NEUROLOGIC:  Steady, even gait.  PSYCH/MENTAL STATUS: Alert, oriented x 3. Cooperative, appropriate mood and affect.    No results found for any visits on 08/18/23.    Assessment & Plan:  1. Periorbital cellulitis of right eye (Primary) - methylPREDNISolone  sodium succinate (SOLU-MEDROL ) 125 mg/2 mL injection 125 mg - cefTRIAXone  (ROCEPHIN ) injection 1 g - Continue Augmentin  as prescribed  Meds ordered this encounter  Medications   methylPREDNISolone  sodium succinate (SOLU-MEDROL ) 125 mg/2 mL injection 125 mg    cefTRIAXone  (ROCEPHIN ) injection 1 g   No orders of the defined types were placed in this encounter.  Lab Orders  No laboratory test(s) ordered today   No images are attached to the encounter or orders placed in the encounter.  Return for Scheduled Routine Office Visits and as needed.   Gavin Kast, FNP

## 2023-08-24 DIAGNOSIS — L301 Dyshidrosis [pompholyx]: Secondary | ICD-10-CM | POA: Diagnosis not present

## 2023-08-24 DIAGNOSIS — L308 Other specified dermatitis: Secondary | ICD-10-CM | POA: Diagnosis not present

## 2023-10-20 ENCOUNTER — Ambulatory Visit: Admitting: Internal Medicine

## 2023-10-20 ENCOUNTER — Encounter: Payer: Self-pay | Admitting: Internal Medicine

## 2023-10-20 VITALS — BP 118/78 | HR 78 | Temp 98.3°F | Ht 63.0 in | Wt 165.0 lb

## 2023-10-20 DIAGNOSIS — Z0000 Encounter for general adult medical examination without abnormal findings: Secondary | ICD-10-CM | POA: Diagnosis not present

## 2023-10-20 DIAGNOSIS — L309 Dermatitis, unspecified: Secondary | ICD-10-CM

## 2023-10-20 DIAGNOSIS — Z87448 Personal history of other diseases of urinary system: Secondary | ICD-10-CM | POA: Insufficient documentation

## 2023-10-20 DIAGNOSIS — Z13220 Encounter for screening for lipoid disorders: Secondary | ICD-10-CM

## 2023-10-20 DIAGNOSIS — N895 Stricture and atresia of vagina: Secondary | ICD-10-CM | POA: Insufficient documentation

## 2023-10-20 DIAGNOSIS — M21611 Bunion of right foot: Secondary | ICD-10-CM | POA: Diagnosis not present

## 2023-10-20 DIAGNOSIS — Z23 Encounter for immunization: Secondary | ICD-10-CM

## 2023-10-20 DIAGNOSIS — Z1159 Encounter for screening for other viral diseases: Secondary | ICD-10-CM | POA: Diagnosis not present

## 2023-10-20 LAB — CBC WITH DIFFERENTIAL/PLATELET
Basophils Absolute: 0 K/uL (ref 0.0–0.1)
Basophils Relative: 1 % (ref 0.0–3.0)
Eosinophils Absolute: 0.1 K/uL (ref 0.0–0.7)
Eosinophils Relative: 2 % (ref 0.0–5.0)
HCT: 40.7 % (ref 36.0–46.0)
Hemoglobin: 13.5 g/dL (ref 12.0–15.0)
Lymphocytes Relative: 33.3 % (ref 12.0–46.0)
Lymphs Abs: 1.6 K/uL (ref 0.7–4.0)
MCHC: 33.1 g/dL (ref 30.0–36.0)
MCV: 87.2 fl (ref 78.0–100.0)
Monocytes Absolute: 0.4 K/uL (ref 0.1–1.0)
Monocytes Relative: 7.9 % (ref 3.0–12.0)
Neutro Abs: 2.6 K/uL (ref 1.4–7.7)
Neutrophils Relative %: 55.8 % (ref 43.0–77.0)
Platelets: 321 K/uL (ref 150.0–400.0)
RBC: 4.66 Mil/uL (ref 3.87–5.11)
RDW: 13.6 % (ref 11.5–15.5)
WBC: 4.7 K/uL (ref 4.0–10.5)

## 2023-10-20 LAB — LIPID PANEL
Cholesterol: 185 mg/dL (ref 0–200)
HDL: 65.7 mg/dL (ref >39.00)
LDL Cholesterol: 111 mg/dL — ABNORMAL HIGH (ref 0–99)
NonHDL: 119.7
Total CHOL/HDL Ratio: 3
Triglycerides: 42 mg/dL (ref 0.0–149.0)
VLDL: 8.4 mg/dL (ref 0.0–40.0)

## 2023-10-20 LAB — COMPREHENSIVE METABOLIC PANEL WITH GFR
ALT: 15 U/L (ref 0–35)
AST: 16 U/L (ref 0–37)
Albumin: 4.5 g/dL (ref 3.5–5.2)
Alkaline Phosphatase: 41 U/L (ref 39–117)
BUN: 14 mg/dL (ref 6–23)
CO2: 27 meq/L (ref 19–32)
Calcium: 9.7 mg/dL (ref 8.4–10.5)
Chloride: 104 meq/L (ref 96–112)
Creatinine, Ser: 0.74 mg/dL (ref 0.40–1.20)
GFR: 103.14 mL/min (ref >60.00)
Glucose, Bld: 99 mg/dL (ref 70–99)
Potassium: 4 meq/L (ref 3.5–5.1)
Sodium: 139 meq/L (ref 135–145)
Total Bilirubin: 0.4 mg/dL (ref 0.2–1.2)
Total Protein: 7.4 g/dL (ref 6.0–8.3)

## 2023-10-20 LAB — TSH: TSH: 0.39 u[IU]/mL (ref 0.35–5.50)

## 2023-10-20 MED ORDER — CLOBETASOL PROPIONATE 0.05 % EX CREA: TOPICAL_CREAM | Freq: Two times a day (BID) | CUTANEOUS | 2 refills | Status: AC

## 2023-10-20 NOTE — Patient Instructions (Addendum)
 Return in 2 months for 2nd HPV vaccine (12/20/23). After 2nd HPV vaccine, return in 4 months for 3rd vaccine (04/21/2024).   Eczema: Do clobetasol cream. Let dry, then apply moisturizer (Aveeno Eczema or Vaseline)

## 2023-10-20 NOTE — Progress Notes (Signed)
 Subjective:   Shelly Snyder 16-May-1985  10/20/2023   CC: Chief Complaint  Patient presents with   Annual Exam    FASTING  Discuss referrals     HPI: Shelly Snyder is a 38 y.o. female who presents for a routine health maintenance exam.  Labs collected at time of visit.   Patient would like a referral to dermatology for her eczema.  She uses clobetasol cream as needed. She would also like a referral to podiatry for a bunion on her right great toe.  HEALTH SCREENINGS: - Pap smear: deferred - will do with OBGYN has hx of vaginal stenosis and urethral narrowing - Mammogram (40+): Not applicable  - Colonoscopy (45+): Not applicable  - Bone Density (65+): Not applicable  - Lung CA screening with low-dose CT:  Not applicable Adults age 53-80 who are current cigarette smokers or quit within the last 15 years. Must have 20 pack year history.   Depression and Anxiety Screen done today and results listed below:     10/20/2023    8:42 AM 08/18/2023    9:00 AM 08/09/2023    3:11 PM  Depression screen PHQ 2/9  Decreased Interest 0 0 0  Down, Depressed, Hopeless 1 0 0  PHQ - 2 Score 1 0 0  Altered sleeping 1  1  Tired, decreased energy 0  1  Change in appetite 0  1  Feeling bad or failure about yourself  0  0  Trouble concentrating 0  0  Moving slowly or fidgety/restless 0  0  Suicidal thoughts 0  0  PHQ-9 Score 2  3  Difficult doing work/chores Somewhat difficult  Not difficult at all      10/20/2023    8:42 AM 08/09/2023    3:11 PM  GAD 7 : Generalized Anxiety Score  Nervous, Anxious, on Edge 0 0  Control/stop worrying 0 0  Worry too much - different things 0 0  Trouble relaxing 0 0  Restless 0 0  Easily annoyed or irritable 2 0  Afraid - awful might happen 0 0  Total GAD 7 Score 2 0  Anxiety Difficulty Not difficult at all Not difficult at all    IMMUNIZATIONS: - Tdap: Tetanus vaccination status reviewed: last tetanus booster within 10 years. - HPV: Administered  today - Influenza: Postponed to flu season   Past medical history, surgical history, medications, allergies, family history and social history reviewed with patient today and changes made to appropriate areas of the chart.   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   docusate sodium  (COLACE) 100 MG capsule Take 100 mg by mouth 2 (two) times daily as needed.   ibuprofen (ADVIL,MOTRIN) 400 MG tablet Take 400 mg by mouth every 6 (six) hours as needed for mild pain.   mometasone (ELOCON) 0.1 % cream  Apply topically 2 (two) times daily.   oxyCODONE  (OXY IR/ROXICODONE ) 5 MG immediate release tablet Take 5 mg by mouth every 6 (six) hours as needed for moderate pain (pain score 4-6) or severe pain (pain score 7-10).   No current facility-administered medications on file prior to visit.    Allergies  Allergen Reactions   Cefdinir Dermatitis     Social History   Socioeconomic History   Marital status: Single    Spouse name: Not on file   Number of children: Not on file   Years of education: Not on file   Highest education level: Not on file  Occupational History   Not on file  Tobacco Use   Smoking status: Never   Smokeless tobacco: Never  Substance and Sexual Activity   Alcohol use: Never   Drug use: Never   Sexual activity: Yes    Birth control/protection: None   Other Topics Concern   Not on file  Social History Narrative   ** Merged History Encounter **       Social Drivers of Corporate investment banker Strain: Not on file  Food Insecurity: Not on file  Transportation Needs: Not on file  Physical Activity: Not on file  Stress: Not on file  Social Connections: Not on file  Intimate Partner Violence: Not on file   Social History   Tobacco Use  Smoking Status Never  Smokeless Tobacco Never   Social History   Substance and Sexual Activity  Alcohol Use Never    Family History  Problem Relation Age of Onset   Fibromyalgia Mother    Hyperlipidemia Mother    Diabetes type II Father    Cerebral palsy Sister    Breast cancer Paternal Aunt    Cervical cancer Maternal Grandmother    Stroke Cousin      ROS: No fever, fatigue, unexplained weight loss/gain, hearing or vision changes, cardiac or respiratory complaints. Denies neurological deficits, musculoskeletal complaints, gastrointestinal or genitourinary complaints, mental health complaints.  Objective:   Today's Vitals   10/20/23 0844  BP: 118/78  Pulse: 78  Temp: 98.3 F (36.8 C)  TempSrc: Oral  SpO2: 100%  Weight: 165 lb (74.8 kg)  Height: 5' 3 (1.6 m)    GENERAL APPEARANCE: Well-appearing, in NAD. Well nourished.  SKIN: Pink, warm and dry. Turgor normal. No lesion, ulceration, or ecchymoses.  Dry, cracked, nonerythematous rash to feet and hands. Hair evenly distributed.  HEENT: HEAD: Normocephalic.  EYES: PERRLA. EOMI. Lids intact w/o defect. Sclera white, Conjunctiva pink w/o exudate.  EARS: External ear w/o redness, swelling, masses or lesions. EAC clear. TM's intact, translucent w/o bulging, appropriate landmarks visualized. Appropriate acuity to conversational tones.  NOSE: Septum midline w/o deformity. Nares patent, mucosa pink and non-inflamed w/o drainage.  THROAT: Uvula midline. Oropharynx clear. Tonsils non-inflamed w/o exudate. Oral mucosa pink and  moist.  NECK: Supple, Trachea midline. Full ROM w/o pain or tenderness. No lymphadenopathy. Thyroid non-tender w/o enlargement or palpable masses.  RESPIRATORY: Chest wall symmetrical w/o masses. Respirations even and non-labored. Breath sounds clear to auscultation bilaterally. No wheezes, rales, rhonchi, or crackles. CARDIAC: S1, S2 present, regular rate and rhythm. No gallops, murmurs, rubs, or clicks. Capillary refill <2 seconds. Peripheral pulses 2+ bilaterally. GI: Abdomen soft w/o distention. Normoactive bowel sounds. No palpable masses or tenderness. No guarding or rebound tenderness. Liver and spleen w/o tenderness or enlargement. No CVA tenderness.  MSK: Muscle tone and strength appropriate for age, w/o atrophy or abnormal movement.  Bunion to right great toe EXTREMITIES: Active ROM intact, w/o tenderness, crepitus, or contracture. No obvious joint deformities or effusions. No clubbing, edema, or cyanosis.  NEUROLOGIC: CN's II-XII intact. Motor strength symmetrical with no obvious weakness. No sensory deficits. Steady, even gait.  PSYCH/MENTAL STATUS: Alert, oriented x 3. Cooperative, appropriate mood and affect.    Assessment & Plan:  Encounter for general adult medical examination without abnormal findings -     CBC with Differential/Platelet -     Comprehensive metabolic panel with GFR -     TSH -     Lipid panel  Need for HPV vaccine -     HPV 9-valent vaccine,Recombinat -    Will return in October for second HPV vaccine, and then February 2026 for third and final HPV vaccine  Need for hepatitis C screening test -     Hepatitis C antibody  Eczema, unspecified type -     Ambulatory referral to Dermatology -     Clobetasol Propionate; Apply topically 2 (two) times daily.  Dispense: 60 g; Refill: 2 -    Discussed with patient after placing clobetasol cream on eczema, then apply Aveeno eczema or Vaseline for moisture.  Bunion of great toe of right foot -     Ambulatory  referral to Podiatry    Orders Placed This Encounter  Procedures   HPV 9-valent vaccine,Recombinat   CBC with Differential/Platelet   Comprehensive metabolic panel with GFR   TSH   Lipid panel   Hepatitis C antibody   Ambulatory referral to Dermatology    Referral Priority:   Routine    Referral Type:   Consultation    Referral Reason:   Specialty Services Required    Requested Specialty:   Dermatology    Number of Visits Requested:   1   Ambulatory referral to Podiatry    Referral Priority:   Routine    Referral Type:   Consultation    Referral Reason:   Specialty Services Required    Requested Specialty:   Podiatry    Number of Visits Requested:   1    PATIENT COUNSELING:  - Encourage a healthy well-balanced diet. Patient may adjust caloric intake to maintain or achieve ideal body weight. May reduce intake of dietary saturated fat and total fat and have adequate dietary potassium and calcium preferably from fresh fruits, vegetables, and low-fat dairy products.    - importance of regular exercise  NEXT PREVENTATIVE PHYSICAL DUE IN 1 YEAR.  Return in about 1 year (around 10/19/2024) for Annual Physical Exam with fasting lab work.  Rosina Senters, FNP

## 2023-10-21 LAB — HEPATITIS C ANTIBODY: Hepatitis C Ab: NONREACTIVE

## 2023-10-23 ENCOUNTER — Ambulatory Visit: Payer: Self-pay | Admitting: Internal Medicine

## 2023-11-27 ENCOUNTER — Ambulatory Visit: Payer: Self-pay | Admitting: Podiatry

## 2023-12-05 ENCOUNTER — Telehealth: Payer: Self-pay | Admitting: Internal Medicine

## 2023-12-05 NOTE — Telephone Encounter (Signed)
 Prescription Request  12/05/2023  LOV: 10/20/2023  What is the name of the medication or equipment? doxycycline  (MONODOX ) 100 MG capsule [[id number]]   Have you contacted your pharmacy to request a refill? Yes, she needs a script, she is waiting to go see her dermo for this.   Which pharmacy would you like this sent to?  [id number] [place], [place] - [phone] AT [facility] OF [facility] [place] [address] Phone: [phone] Fax: [phone]    Patient notified that their request is being sent to the clinical staff for review and that they should receive a response within 2 business days.   Please advise at [facility] [phone]

## 2023-12-05 NOTE — Telephone Encounter (Signed)
 Pt has been referred to [facility] by Rosina Senters on 10/20/23, they can't see her until May 2026. She would like this referral sent to [facility].

## 2023-12-06 ENCOUNTER — Other Ambulatory Visit: Payer: Self-pay | Admitting: Internal Medicine

## 2023-12-06 DIAGNOSIS — L309 Dermatitis, unspecified: Secondary | ICD-10-CM

## 2023-12-06 MED ORDER — DOXYCYCLINE HYCLATE 100 MG PO TABS: 100.0000 mg | ORAL_TABLET | Freq: Every day | ORAL | 0 refills | Status: AC

## 2023-12-06 NOTE — Telephone Encounter (Signed)
Rx sent in for patient

## 2023-12-08 NOTE — Telephone Encounter (Signed)
 Done.. Also sent referral to [facility] to see which one can get her in sooner

## 2023-12-11 ENCOUNTER — Ambulatory Visit (INDEPENDENT_AMBULATORY_CARE_PROVIDER_SITE_OTHER): Admitting: Podiatry

## 2023-12-11 ENCOUNTER — Ambulatory Visit (INDEPENDENT_AMBULATORY_CARE_PROVIDER_SITE_OTHER)

## 2023-12-11 VITALS — Ht 63.0 in | Wt 165.0 lb

## 2023-12-11 DIAGNOSIS — M2011 Hallux valgus (acquired), right foot: Secondary | ICD-10-CM | POA: Diagnosis not present

## 2023-12-11 DIAGNOSIS — M21611 Bunion of right foot: Secondary | ICD-10-CM | POA: Diagnosis not present

## 2023-12-11 DIAGNOSIS — M21619 Bunion of unspecified foot: Secondary | ICD-10-CM

## 2023-12-11 NOTE — Progress Notes (Signed)
   Chief Complaint  Patient presents with   Foot Pain   Bunions    Rm 7 Patient is here for right foot bunion. Pt states right bunion pain has been present for x 20 years.    Subjective: 38 y.o. female presents today as a new patient for evaluation of a symptomatic bunion to the right foot ongoing for several years.  Progressively this has gotten worse.  Today is very symptomatic especially in shoes.  She has tried different shoe gear and modifications with no improvement.  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  Dermatology: Skin is cool, dry and supple bilateral lower extremities. Negative for open lesions or macerations.  Eczematous lesions noted to the plantar medial aspect of the heel bilateral.  Currently being managed by dermatology  Vascular: Palpable pedal pulses bilaterally. No edema or erythema noted. Capillary refill within normal limits.  Neurological: Grossly intact via light touch  Musculoskeletal Exam: Clinical evidence of bunion deformity noted to the respective foot. There is moderate pain on palpation range of motion of the first MPJ. Lateral deviation of the hallux noted consistent with hallux abductovalgus.  Pes planovalgus deformity also noted   Radiographic Exam RT foot 12/11/2023: Normal osseous mineralization.  Increased intermetatarsal angle with moderate deviation of the  first metatarsal  Assessment: 1.  Hallux valgus right 2.  Pes planovalgus deformity bilateral; asymptomatic 3.  Pustular eczema    Plan of Care:  -Patient was evaluated. X-Rays reviewed. -Today we discussed both conservative and surgical management of bunion deformity.  This is progressively become worse over several years and she has pain on a daily basis affecting her quality of life.  She has tried different conservative measures and shoe gear modifications with no improvement.  She would like to proceed with surgical correction at this time -Today we discussed bunion surgery including the risk benefits advantages and disadvantages as well as the postoperative recovery course.  All patient questions were answered.  No guarantees were expressed or implied.  She would like to proceed with surgery at this time -Authorization for surgery was initiated today.  Surgery will consist of bunionectomy with osteotomy right -Return to clinic 1 week postop  *Medical Assistant for primary care. Recommended 8 weeks off postop   Thresa EMERSON Sar, DPM [facility]  Dr. Thresa EMERSON Sar, DPM    [address] [place], [place] [address]                Office [phone]  Fax [phone]

## 2023-12-14 ENCOUNTER — Telehealth: Payer: Self-pay | Admitting: Podiatry

## 2023-12-14 NOTE — Telephone Encounter (Signed)
 Received surgical consent form   Left message for pt to call to schedule the surgery.

## 2023-12-18 ENCOUNTER — Telehealth: Payer: Self-pay | Admitting: Podiatry

## 2023-12-18 NOTE — Telephone Encounter (Signed)
 Pt left message Friday afternoon returning a call to get scheduled for surgery.   She called back again as I was getting ready to call back and is scheduled for surgery on 11/20. Pt not on blood thinners or glp 1 medications and pharmacy correct in chart.

## 2023-12-20 ENCOUNTER — Telehealth: Payer: Self-pay | Admitting: Podiatry

## 2023-12-20 NOTE — Telephone Encounter (Signed)
 DOS- 02/08/2024  [place] BUNIONECTOMY RT- 71703  [employer] EFFECTIVE DATE- 01/20/2023  DEDUCTIBLE- $600 REMAINING- $0 OOP- $9200 REMAINING- $1112.91 COINSURANCE- 30%  PER [facility] PORTAL, NO PRIOR AUTH IS REQUIRED FOR CPT CODE 71703. DOCUMENTATION ATTACHED TO SURGERY CONSENT PACKET.

## 2023-12-26 ENCOUNTER — Ambulatory Visit (INDEPENDENT_AMBULATORY_CARE_PROVIDER_SITE_OTHER)

## 2023-12-26 DIAGNOSIS — Z23 Encounter for immunization: Secondary | ICD-10-CM

## 2023-12-26 NOTE — Progress Notes (Signed)
 Patient is in office today for a nurse visit for her second HPV Vaccine Immunization. Injection was given in the right deltoid by Laymon Gladis Sharps, CMA. Patient tolerated injection well. Next HPV is due at the earliest is 03/21/2024 and the recommended date is 06/25/2024 and patient aware.

## 2024-01-11 ENCOUNTER — Encounter: Payer: Self-pay | Admitting: Podiatry

## 2024-01-15 NOTE — Telephone Encounter (Signed)
 pt lft mess needing fax#. I cld bk and gave and she adv no one told her of $25 for forms. I adv would be $50 for hers and spouse's. I will call her once forms are recd.

## 2024-01-22 DIAGNOSIS — Z0279 Encounter for issue of other medical certificate: Secondary | ICD-10-CM

## 2024-01-22 NOTE — Telephone Encounter (Signed)
 Faxed [employer] [phone] and emailed to pt for her copy She sent [employer] form - will adv her 25.00 for that form I waived the fee for her wife since she said was not told of fees.

## 2024-01-22 NOTE — Telephone Encounter (Signed)
 Faxed [employer] for spouse Verla Charles [phone] and email to pt for her records

## 2024-01-24 DIAGNOSIS — Z0279 Encounter for issue of other medical certificate: Secondary | ICD-10-CM

## 2024-01-24 NOTE — Telephone Encounter (Signed)
 Forms are ready for pick up. Please adv pt if still needs another one sent, just leave and it will be sent again on 01/26/24

## 2024-01-24 NOTE — Telephone Encounter (Signed)
 Faxed [employer] [phone] form for Shelly Snyder pt via MyChart copy of all forms are ready for pick up at office 25.00 paid 01/24/24 for [facility] form

## 2024-02-05 DIAGNOSIS — L301 Dyshidrosis [pompholyx]: Secondary | ICD-10-CM | POA: Diagnosis not present

## 2024-02-05 DIAGNOSIS — L282 Other prurigo: Secondary | ICD-10-CM | POA: Diagnosis not present

## 2024-02-07 NOTE — Telephone Encounter (Signed)
 Pt sent via MyChart [employer] form I faxed on 01/24/24. I will refax per her request

## 2024-02-08 ENCOUNTER — Other Ambulatory Visit: Payer: Self-pay | Admitting: Podiatry

## 2024-02-08 DIAGNOSIS — G8918 Other acute postprocedural pain: Secondary | ICD-10-CM | POA: Diagnosis not present

## 2024-02-08 DIAGNOSIS — M2011 Hallux valgus (acquired), right foot: Secondary | ICD-10-CM | POA: Diagnosis not present

## 2024-02-08 MED ORDER — IBUPROFEN 800 MG PO TABS: 800.0000 mg | ORAL_TABLET | Freq: Three times a day (TID) | ORAL | 1 refills | Status: AC

## 2024-02-08 MED ORDER — OXYCODONE-ACETAMINOPHEN 5-325 MG PO TABS: 1.0000 | ORAL_TABLET | ORAL | 0 refills | Status: AC | PRN

## 2024-02-08 NOTE — Progress Notes (Signed)
 PRN postop

## 2024-02-14 ENCOUNTER — Ambulatory Visit (INDEPENDENT_AMBULATORY_CARE_PROVIDER_SITE_OTHER): Admitting: Podiatry

## 2024-02-14 ENCOUNTER — Ambulatory Visit (INDEPENDENT_AMBULATORY_CARE_PROVIDER_SITE_OTHER)

## 2024-02-14 VITALS — BP 113/63 | HR 73 | Temp 97.9°F

## 2024-02-14 DIAGNOSIS — M2011 Hallux valgus (acquired), right foot: Secondary | ICD-10-CM | POA: Diagnosis not present

## 2024-02-14 NOTE — Progress Notes (Signed)
 Patient presents for post-op visit today, POV # 1 DOS 02/08/24 RT BUNIONECTOMY W/ OSTEOTOMY  Doing okay. No issues. Pain between a 5 and 6..  RN Notes: n/a  Vital Signs: Today's Vitals   02/14/24 0919  BP: 113/63  Pulse: 73  Temp: 97.9 F (36.6 C)  TempSrc: Oral  PainSc: 0-No pain      Radiographs: [x]  Taken []  Not taken  Surgical Site Assessment:  - Dressing:  [x]  Minimal dry blood, intact []  Reinforced   [x]  Changed     -RN Notes: n/a  - Incision:  [x]  CDI (clean, dry, intact)  [x]  Mild erythema  []  Drainage noted   -RN Notes: n/a  - Swelling:  []  None  [x]  Mild  []  Moderate   []  Significant     -RN Notes: n/a  - Bruising:  []  None  [x]  Present: top of foot, lateral aspect ankle.    - Sutures/Staples:  []  None [x]  Intact  []  Removed Today  [x]  Plan to remove at next visit   -Cast/Splint/Pins: [x]  None []  Intact []  Removed Today []  Plan to remove at next visit []  Replaced  -Signs of infection:  [x]  None  []  Present - Describe: n/a  -DME:    []  None [x]  AFW []  Surgical shoe []  Cast  []  Splint  -Walking status:  []  Full WB  [x]  Partial WB  []  NWB  -Utilizing device:  []  None []  Knee Scooter []  Crutches [x]  Wheelchair    DVT assessment:  [x]  Denies symptoms []  Chest pain/SOB []  Pain in calf/redness/warmth   Redressed DSD and ace wrap. Educated on signs of infection, proper dressing care, pain management, and weight bearing status. Patient will contact provider with any new or worsening symptoms. The provider assessed the patient today and reviewed instructions regarding plan of care.

## 2024-02-14 NOTE — Patient Instructions (Signed)

## 2024-02-14 NOTE — Progress Notes (Signed)
   Chief Complaint  Patient presents with   Post-op Follow-up    POV # 1 DOS 02/08/24 RT BUNIONECTOMY W/ OSTEOTOMY  Doing okay. No issues. Pain between a 5 and 6.    Subjective:  Patient presents today status post right Hemi with osteotomy right.  Doing well.  WBAT CAM boot as instructed.  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  Incision well coapted with sutures intact. No sign of infectious process noted. No dehiscence. No active bleeding noted.  No appreciable edema noted.  Overall well-healing surgical foot  Radiographic Exam RT foot 02/14/2024:  Orthopedic hardware and osteotomies sites appear to be stable with routine healing.  Good alignment overall of the first ray  Assessment: 1. s/p bunionectomy with osteotomy right foot. DOS: 02/08/2024   Plan of Care:  -Patient was evaluated. X-rays reviewed - Dressings changed.  Okay to wash and shower and get the foot wet -Continue minimal WBAT CAM boot -Recommend triple antibiotic, nonadherent gauze, and Ace wrap daily.  Supplies provided -Return to clinic 2 weeks suture removal  Thresa EMERSON Sar, DPM [facility]  Dr. Thresa EMERSON Sar, DPM    [address] [place], [place] [address]                Office [phone]  Fax [phone]

## 2024-02-21 ENCOUNTER — Encounter

## 2024-02-28 ENCOUNTER — Ambulatory Visit (INDEPENDENT_AMBULATORY_CARE_PROVIDER_SITE_OTHER): Admitting: Podiatry

## 2024-02-28 DIAGNOSIS — M2011 Hallux valgus (acquired), right foot: Secondary | ICD-10-CM

## 2024-02-28 NOTE — Progress Notes (Signed)
° °  Chief Complaint  Patient presents with   Post-op Follow-up    POV # 2 DOS 02/08/24 RT BUNIONECTOMY W/ OSTEOTOMY  Doing okay, just a little bit of pain, other than that I am alright.    Subjective:  Patient presents today status post right Hemi with osteotomy right.  Doing well.  WBAT CAM boot as instructed.  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  Incision well coapted with sutures intact. No sign of infectious process noted. No dehiscence. No active bleeding noted.  No appreciable edema noted.  Overall well-healing surgical foot  Radiographic Exam RT foot 02/14/2024:  Orthopedic hardware and osteotomies sites appear to be stable with routine healing.  Good alignment overall of the first ray  Assessment: 1. s/p bunionectomy with osteotomy right foot. DOS: 02/08/2024   Plan of Care:  -Patient was evaluated.  -Sutures removed -Recommend silicone scar tape available on [employer] -Continue WBAT cam boot for an additional 4 weeks -Return to clinic 4 weeks for follow-up x-rays and to transition the patient out of the postop shoe back into regular tennis shoes  *Engineer, Site for primary care. Recommended 8 weeks off postop   Thresa EMERSON Sar, DPM [facility]  Dr. Thresa EMERSON Sar, DPM    [address] [place], [place] [address]                Office [phone]   Fax [phone]

## 2024-02-28 NOTE — Progress Notes (Signed)
 Patient presents for post-op visit today, POV # 2 DOS 02/08/24 RT BUNIONECTOMY W/ OSTEOTOMY  Doing okay, just a little bit of pain, other than that I am alright..  RN Notes: n/a  Vital Signs: Today's Vitals   02/28/24 1025  PainSc: 0-No pain      Radiographs: []  Taken [x]  Not taken  Surgical Site Assessment:  - Dressing:  [x]  Minimal dry blood, intact []  Reinforced   []  Changed     -RN Notes: n/a  - Incision:  [x]  CDI (clean, dry, intact)  [x]  Mild erythema  []  Drainage noted   -RN Notes: n/a  - Swelling:  []  None  [x]  Mild  []  Moderate   []  Significant     -RN Notes: n/a  - Bruising:  [x]  None  []  Present: n/a   - Sutures/Staples:  []  None [x]  Intact  [x]  Removed Today  []  Plan to remove at next visit   -Cast/Splint/Pins: [x]  None []  Intact []  Removed Today []  Plan to remove at next visit []  Replaced  -Signs of infection:  [x]  None  []  Present - Describe: n/a  -DME:    []  None [x]  AFW []  Surgical shoe []  Cast  []  Splint  -Walking status:  [x]  Full WB  []  Partial WB  []  NWB  -Utilizing device:  [x]  None []  Knee Scooter []  Crutches []  Wheelchair    DVT assessment:  [x]  Denies symptoms []  Chest pain/SOB []  Pain in calf/redness/warmth   Educated on signs of infection, proper dressing care, pain management, and weight bearing status. Patient will contact provider with any new or worsening symptoms. The provider assessed the patient today and reviewed instructions regarding plan of care.

## 2024-03-06 ENCOUNTER — Encounter: Admitting: Podiatry

## 2024-03-25 ENCOUNTER — Ambulatory Visit (INDEPENDENT_AMBULATORY_CARE_PROVIDER_SITE_OTHER): Admitting: Podiatry

## 2024-03-25 ENCOUNTER — Encounter: Payer: Self-pay | Admitting: Podiatry

## 2024-03-25 ENCOUNTER — Ambulatory Visit (INDEPENDENT_AMBULATORY_CARE_PROVIDER_SITE_OTHER)

## 2024-03-25 VITALS — Ht 63.0 in | Wt 165.0 lb

## 2024-03-25 DIAGNOSIS — M2011 Hallux valgus (acquired), right foot: Secondary | ICD-10-CM | POA: Diagnosis not present

## 2024-03-25 DIAGNOSIS — B353 Tinea pedis: Secondary | ICD-10-CM | POA: Diagnosis not present

## 2024-03-25 MED ORDER — CLOTRIMAZOLE-BETAMETHASONE 1-0.05 % EX CREA: 1.0000 | TOPICAL_CREAM | Freq: Every day | CUTANEOUS | 2 refills | Status: AC

## 2024-03-25 NOTE — Progress Notes (Signed)
" ° °  Chief Complaint  Patient presents with   Routine Post Op    Patient is here for POV # 3 DOS 02/08/24 RT BUNIONECTOMY W/ OSTEOTOMY Doing well overall having some sharp shooting pains, may have fungal infection or dermatitis on bottom of foot    Subjective:  Patient presents today status post right bunionectomy with osteotomy right.  Doing well.  WBAT CAM boot as instructed.    She does have a history of atopic dermatitis to the foot which she manages with clobetasol .  Recently she has began to have itching with skin irritation to the interdigital areas and forefoot concerning for possible tinea pedis/athlete's foot.  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  Incision is nicely healed.  Toes in good rectus alignment.  Minimal edema noted.  Overall well-healing surgical foot  There is an acute flareup of the atopic dermatitis around the right arch of the foot as well as inflammatory dermatitis noted to the interdigital areas and forefoot.  Radiographic Exam RT foot 02/14/2024:  Orthopedic hardware and osteotomies sites appear to be stable with routine healing.  Good alignment overall of the first ray  Assessment: 1. s/p bunionectomy with osteotomy right foot. DOS: 02/08/2024 2.  Possible tinea pedis/athlete's foot  Plan of Care:  -Patient was evaluated.  X-rays reviewed - Okay to transition out of cam boot into good supportive tennis shoes  and sneakers.  Recommend daily range of motion stretching exercises -Prescription for Lotrisone  cream apply twice daily in case there is any fungal element to the patient's dermatitis flareup -Continue clobetasol  as prescribed by her dermatologist for acute flareup of the atopic dermatitis -Scheduled to return to work around the beginning of February. -Return to clinic 6 weeks follow-up x-ray  *Engineer, Site for primary care. Recommended 8 weeks off postop   Thresa EMERSON Sar, DPM [facility]  Dr. Thresa EMERSON Sar, DPM    [address] [place], [place] [address]                Office [phone]  Fax [phone]      "

## 2024-06-10 ENCOUNTER — Ambulatory Visit: Admitting: Physician Assistant

## 2024-06-25 ENCOUNTER — Ambulatory Visit

## 2024-10-21 ENCOUNTER — Encounter: Admitting: Internal Medicine
